# Patient Record
Sex: Male | Born: 1995
Health system: Southern US, Community
[De-identification: ages and names within clinical notes are randomized; demographics above are authoritative.]

---
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
## 2017-11-19 ENCOUNTER — Emergency Department (HOSPITAL_COMMUNITY)
Admission: EM | Admit: 2017-11-19 | Discharge: 2017-11-19 | Payer: Medicaid Other | Attending: Emergency Medicine | Admitting: Emergency Medicine

## 2017-11-19 ENCOUNTER — Emergency Department (HOSPITAL_COMMUNITY): Payer: Medicaid Other

## 2017-11-19 ENCOUNTER — Other Ambulatory Visit: Payer: Self-pay

## 2017-11-19 DIAGNOSIS — S99911A Unspecified injury of right ankle, initial encounter: Secondary | ICD-10-CM | POA: Diagnosis present

## 2017-11-19 DIAGNOSIS — Y999 Unspecified external cause status: Secondary | ICD-10-CM | POA: Insufficient documentation

## 2017-11-19 DIAGNOSIS — X501XXA Overexertion from prolonged static or awkward postures, initial encounter: Secondary | ICD-10-CM | POA: Insufficient documentation

## 2017-11-19 DIAGNOSIS — Y929 Unspecified place or not applicable: Secondary | ICD-10-CM | POA: Diagnosis not present

## 2017-11-19 DIAGNOSIS — S93401A Sprain of unspecified ligament of right ankle, initial encounter: Secondary | ICD-10-CM

## 2017-11-19 DIAGNOSIS — Y9302 Activity, running: Secondary | ICD-10-CM | POA: Insufficient documentation

## 2017-11-19 MED ORDER — IBUPROFEN 200 MG PO TABS
600.0000 mg | ORAL_TABLET | Freq: Once | ORAL | Status: AC
  Administered 2017-11-19: 600 mg via ORAL
  Filled 2017-11-19: qty 3
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
## 2017-11-19 NOTE — Discharge Instructions (Addendum)
You will need to alternate Tylenol and ibuprofen to help with pain. Put ice on the area to help with swelling. Return to ED for worsening symptoms, increased swelling, redness or warmth of joint or numbness in arms/legs.

## 2017-11-19 NOTE — ED Notes (Signed)
Bed: ZO10 Expected date:  Expected time:  Means of arrival:  Comments: 22 yo ankle injury

## 2017-11-20 ENCOUNTER — Emergency Department (HOSPITAL_BASED_OUTPATIENT_CLINIC_OR_DEPARTMENT_OTHER): Payer: Medicaid Other

## 2017-11-20 ENCOUNTER — Encounter (HOSPITAL_BASED_OUTPATIENT_CLINIC_OR_DEPARTMENT_OTHER): Payer: Self-pay

## 2017-11-20 ENCOUNTER — Emergency Department (HOSPITAL_BASED_OUTPATIENT_CLINIC_OR_DEPARTMENT_OTHER)
Admission: EM | Admit: 2017-11-20 | Discharge: 2017-11-20 | Disposition: A | Discharge status: Home / Self Care | Payer: Medicaid Other | Attending: Emergency Medicine | Admitting: Emergency Medicine

## 2017-11-20 ENCOUNTER — Other Ambulatory Visit: Payer: Self-pay

## 2017-11-20 DIAGNOSIS — M79671 Pain in right foot: Secondary | ICD-10-CM | POA: Insufficient documentation

## 2017-11-20 NOTE — Discharge Instructions (Signed)
You were seen in the ER for right foot pain.  X-rays today are not normal.  Pain is likely from dependent swelling and inflammatory response from your recent injury.  This could be from injuries to the soft tissue/ligaments or tendons around the foot.  Take 600 mg of ibuprofen (ibuprofen) every 6 hours for pain and swelling.  If you need more pain control you can add 500 to 1000 mg of acetaminophen (Tylenol) every 6 hours as well.  Elevate.  Ice.  Use her crutches for the next 48 hours to rest her foot.  Follow-up with orthopedist in 7 to 10 days if your pain persists.  Return to the ER if there is redness, warmth to the skin of your extremity, calf pain or swelling.
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
## 2017-11-20 NOTE — ED Triage Notes (Addendum)
Pt twisted ankle 2 days ago-states while playing basketball-was seen at [facility] ED yesterday and given ortho f/u-pt states he is here due to cont'd pain-NAD-steady gait

## 2017-11-28 ENCOUNTER — Inpatient Hospital Stay (INDEPENDENT_AMBULATORY_CARE_PROVIDER_SITE_OTHER): Payer: Self-pay | Admitting: Orthopaedic Surgery

## 2018-09-26 MED FILL — CHLORHEXIDINE 0.12% RINSE: 0.12 | 16 days supply | Qty: 473 | Fill #0

## 2019-05-12 ENCOUNTER — Emergency Department (HOSPITAL_BASED_OUTPATIENT_CLINIC_OR_DEPARTMENT_OTHER): Payer: Medicaid Other

## 2019-05-12 ENCOUNTER — Encounter (HOSPITAL_BASED_OUTPATIENT_CLINIC_OR_DEPARTMENT_OTHER): Payer: Self-pay | Admitting: *Deleted

## 2019-05-12 ENCOUNTER — Emergency Department (HOSPITAL_BASED_OUTPATIENT_CLINIC_OR_DEPARTMENT_OTHER)
Admission: EM | Admit: 2019-05-12 | Discharge: 2019-05-13 | Disposition: A | Discharge status: Other Acute Inpatient Hospital | Payer: Medicaid Other | Attending: Emergency Medicine | Admitting: Emergency Medicine

## 2019-05-12 ENCOUNTER — Other Ambulatory Visit: Payer: Self-pay

## 2019-05-12 DIAGNOSIS — Y9289 Other specified places as the place of occurrence of the external cause: Secondary | ICD-10-CM | POA: Insufficient documentation

## 2019-05-12 DIAGNOSIS — K668 Other specified disorders of peritoneum: Secondary | ICD-10-CM | POA: Insufficient documentation

## 2019-05-12 DIAGNOSIS — S36113A Laceration of liver, unspecified degree, initial encounter: Secondary | ICD-10-CM | POA: Diagnosis not present

## 2019-05-12 DIAGNOSIS — Y93I9 Activity, other involving external motion: Secondary | ICD-10-CM | POA: Diagnosis not present

## 2019-05-12 DIAGNOSIS — Z20822 Contact with and (suspected) exposure to covid-19: Secondary | ICD-10-CM | POA: Diagnosis not present

## 2019-05-12 DIAGNOSIS — R079 Chest pain, unspecified: Secondary | ICD-10-CM

## 2019-05-12 DIAGNOSIS — Y999 Unspecified external cause status: Secondary | ICD-10-CM | POA: Diagnosis not present

## 2019-05-12 DIAGNOSIS — S3991XA Unspecified injury of abdomen, initial encounter: Secondary | ICD-10-CM | POA: Diagnosis present

## 2019-05-12 LAB — CBC WITH DIFFERENTIAL/PLATELET
Abs Immature Granulocytes: 0.04 10*3/uL (ref 0.00–0.07)
Basophils Absolute: 0 10*3/uL (ref 0.0–0.1)
Basophils Relative: 0 %
Eosinophils Absolute: 0 10*3/uL (ref 0.0–0.5)
Eosinophils Relative: 0 %
HCT: 41.9 % (ref 39.0–52.0)
Hemoglobin: 13.7 g/dL (ref 13.0–17.0)
Immature Granulocytes: 0 %
Lymphocytes Relative: 8 %
Lymphs Abs: 1.2 10*3/uL (ref 0.7–4.0)
MCH: 27.1 pg (ref 26.0–34.0)
MCHC: 32.7 g/dL (ref 30.0–36.0)
MCV: 83 fL (ref 80.0–100.0)
Monocytes Absolute: 0.7 10*3/uL (ref 0.1–1.0)
Monocytes Relative: 5 %
Neutro Abs: 11.9 10*3/uL — ABNORMAL HIGH (ref 1.7–7.7)
Neutrophils Relative %: 87 %
Platelets: 260 10*3/uL (ref 150–400)
RBC: 5.05 MIL/uL (ref 4.22–5.81)
RDW: 13.8 % (ref 11.5–15.5)
WBC: 13.8 10*3/uL — ABNORMAL HIGH (ref 4.0–10.5)
nRBC: 0 % (ref 0.0–0.2)

## 2019-05-12 LAB — COMPREHENSIVE METABOLIC PANEL
ALT: 258 U/L — ABNORMAL HIGH (ref 0–44)
AST: 181 U/L — ABNORMAL HIGH (ref 15–41)
Albumin: 4.1 g/dL (ref 3.5–5.0)
Alkaline Phosphatase: 60 U/L (ref 38–126)
Anion gap: 10 (ref 5–15)
BUN: 18 mg/dL (ref 6–20)
CO2: 26 mmol/L (ref 22–32)
Calcium: 9.1 mg/dL (ref 8.9–10.3)
Chloride: 101 mmol/L (ref 98–111)
Creatinine, Ser: 1.03 mg/dL (ref 0.61–1.24)
GFR calc Af Amer: >60 mL/min (ref >60)
GFR calc non Af Amer: >60 mL/min (ref >60)
Glucose, Bld: 102 mg/dL — ABNORMAL HIGH (ref 70–99)
Potassium: 3.5 mmol/L (ref 3.5–5.1)
Sodium: 137 mmol/L (ref 135–145)
Total Bilirubin: 2.7 mg/dL — ABNORMAL HIGH (ref 0.3–1.2)
Total Protein: 7 g/dL (ref 6.5–8.1)

## 2019-05-12 LAB — LIPASE, BLOOD: Lipase: 33 U/L (ref 11–51)

## 2019-05-12 MED ORDER — FENTANYL CITRATE (PF) 100 MCG/2ML IJ SOLN
50.0000 ug | Freq: Once | INTRAMUSCULAR | Status: AC
  Administered 2019-05-12: 23:00:00 50 ug via INTRAVENOUS
  Filled 2019-05-12: qty 2

## 2019-05-12 MED ORDER — HYDROMORPHONE HCL 1 MG/ML IJ SOLN
0.5000 mg | Freq: Once | INTRAMUSCULAR | Status: AC
  Administered 2019-05-12: 0.5 mg via INTRAVENOUS
  Filled 2019-05-12: qty 1

## 2019-05-12 MED ORDER — IOHEXOL 300 MG/ML  SOLN
100.0000 mL | Freq: Once | INTRAMUSCULAR | Status: AC | PRN
  Administered 2019-05-12: 100 mL via INTRAVENOUS

## 2019-05-12 NOTE — ED Notes (Signed)
Pt to XR

## 2019-05-12 NOTE — ED Notes (Signed)
ED Provider at bedside. 
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
## 2019-05-13 DIAGNOSIS — K668 Other specified disorders of peritoneum: Secondary | ICD-10-CM | POA: Diagnosis not present

## 2019-05-13 DIAGNOSIS — Z20822 Contact with and (suspected) exposure to covid-19: Secondary | ICD-10-CM | POA: Diagnosis not present

## 2019-05-13 DIAGNOSIS — Y9289 Other specified places as the place of occurrence of the external cause: Secondary | ICD-10-CM | POA: Diagnosis not present

## 2019-05-13 DIAGNOSIS — Y999 Unspecified external cause status: Secondary | ICD-10-CM | POA: Diagnosis not present

## 2019-05-13 DIAGNOSIS — Y93I9 Activity, other involving external motion: Secondary | ICD-10-CM | POA: Diagnosis not present

## 2019-05-13 DIAGNOSIS — S3991XA Unspecified injury of abdomen, initial encounter: Secondary | ICD-10-CM | POA: Diagnosis present

## 2019-05-13 DIAGNOSIS — S36113A Laceration of liver, unspecified degree, initial encounter: Secondary | ICD-10-CM | POA: Diagnosis not present

## 2019-05-13 LAB — RESPIRATORY PANEL BY RT PCR (FLU A&B, COVID)
Influenza A by PCR: NEGATIVE
Influenza B by PCR: NEGATIVE
SARS Coronavirus 2 by RT PCR: NEGATIVE

## 2019-05-13 NOTE — ED Notes (Signed)
Pt transported by [facility] to [facility].
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
## 2023-06-12 ENCOUNTER — Other Ambulatory Visit (HOSPITAL_BASED_OUTPATIENT_CLINIC_OR_DEPARTMENT_OTHER): Payer: Self-pay

## 2023-06-12 MED ORDER — IBUPROFEN 800 MG PO TABS
800.0000 mg | ORAL_TABLET | Freq: Three times a day (TID) | ORAL | 0 refills | Status: AC | PRN
  Filled 2023-06-12: qty 30, 10d supply, fill #0

## 2023-06-12 MED ORDER — TRAMADOL HCL 50 MG PO TABS
50.0000 mg | ORAL_TABLET | Freq: Three times a day (TID) | ORAL | 0 refills | Status: AC | PRN
  Filled 2023-06-12: qty 12, 4d supply, fill #0

## 2023-06-12 MED ORDER — AMOXICILLIN 500 MG PO CAPS
500.0000 mg | ORAL_CAPSULE | Freq: Three times a day (TID) | ORAL | 0 refills | Status: AC
  Filled 2023-06-12: qty 21, 7d supply, fill #0
